# Patient Record
Sex: Female | Born: 1975 | Race: Black or African American | Hispanic: No
Health system: Southern US, Community
[De-identification: ages and names within clinical notes are randomized; demographics above are authoritative.]

## PROBLEM LIST (undated history)

## (undated) DIAGNOSIS — R569 Unspecified convulsions: Secondary | ICD-10-CM

## (undated) DIAGNOSIS — G039 Meningitis, unspecified: Secondary | ICD-10-CM

## (undated) DIAGNOSIS — G9340 Encephalopathy, unspecified: Secondary | ICD-10-CM

## (undated) HISTORY — PX: SHUNT REVISION VENTRICULAR-PERITONEAL: SHX6094

---

## 2016-03-23 ENCOUNTER — Emergency Department (HOSPITAL_BASED_OUTPATIENT_CLINIC_OR_DEPARTMENT_OTHER)
Admission: EM | Admit: 2016-03-23 | Discharge: 2016-03-23 | Disposition: A | Discharge status: Home / Self Care | Payer: Medicare Other | Attending: Emergency Medicine | Admitting: Emergency Medicine

## 2016-03-23 ENCOUNTER — Encounter (HOSPITAL_BASED_OUTPATIENT_CLINIC_OR_DEPARTMENT_OTHER): Payer: Self-pay

## 2016-03-23 ENCOUNTER — Emergency Department (HOSPITAL_BASED_OUTPATIENT_CLINIC_OR_DEPARTMENT_OTHER): Payer: Medicare Other

## 2016-03-23 DIAGNOSIS — Z79899 Other long term (current) drug therapy: Secondary | ICD-10-CM | POA: Insufficient documentation

## 2016-03-23 DIAGNOSIS — Z791 Long term (current) use of non-steroidal anti-inflammatories (NSAID): Secondary | ICD-10-CM | POA: Diagnosis not present

## 2016-03-23 DIAGNOSIS — K5900 Constipation, unspecified: Secondary | ICD-10-CM

## 2016-03-23 DIAGNOSIS — F918 Other conduct disorders: Secondary | ICD-10-CM | POA: Diagnosis present

## 2016-03-23 HISTORY — DX: Meningitis, unspecified: G03.9

## 2016-03-23 HISTORY — DX: Encephalopathy, unspecified: G93.40

## 2016-03-23 HISTORY — DX: Unspecified convulsions: R56.9

## 2016-03-23 LAB — URINALYSIS, ROUTINE W REFLEX MICROSCOPIC
BILIRUBIN URINE: NEGATIVE
Glucose, UA: NEGATIVE mg/dL
HGB URINE DIPSTICK: NEGATIVE
KETONES UR: NEGATIVE mg/dL
Leukocytes, UA: NEGATIVE
NITRITE: NEGATIVE
Protein, ur: NEGATIVE mg/dL
SPECIFIC GRAVITY, URINE: 1.016 (ref 1.005–1.030)
pH: 6.5 (ref 5.0–8.0)

## 2016-03-23 MED ORDER — POLYETHYLENE GLYCOL 3350 17 GM/SCOOP PO POWD: 17.0000 g | Freq: Two times a day (BID) | ORAL | 0 refills | Status: DC

## 2016-03-23 MED ORDER — FLEET ENEMA 7-19 GM/118ML RE ENEM: 1.0000 | ENEMA | Freq: Every day | RECTAL | 0 refills | Status: DC | PRN

## 2016-03-23 MED ORDER — DOCUSATE SODIUM 100 MG PO CAPS: 100.0000 mg | ORAL_CAPSULE | Freq: Two times a day (BID) | ORAL | 0 refills | Status: DC

## 2016-03-23 MED FILL — FLEET ENEMA: 7-19 | 2 days supply | Qty: 266 | Fill #0

## 2016-03-23 MED FILL — POLYETHYLENE GLYCOL 3350 PO: 7 days supply | Qty: 238 | Fill #0

## 2016-03-23 MED FILL — DOK 100 MG SOFTGEL: 100 | 50 days supply | Qty: 100 | Fill #0

## 2016-03-23 NOTE — ED Provider Notes (Signed)
[facility] Provider Note   CSN: [id number] Arrival date & time: 03/23/16  1547  First Provider Contact:   First MD Initiated Contact with Patient 03/23/16 1607     By signing my name below, I, Emmanuella Mensah, attest that this documentation has been prepared under the direction and in the presence of Roxy Horsemanobert Juana Haralson, PA-C. Electronically Signed: Angelene GiovanniEmmanuella Mensah, ED Scribe. 03/23/16. 4:20 PM.   History   Chief Complaint Chief Complaint  Patient presents with  . Aggressive Behavior    HPI Comments: Level 5 Caveat due to pt being nonverbal Laural GoldenKeisha Recht is a 40 y.o. female who presents to the Emergency Department with mother requesting UA for possible UTI. Mother states that she has noticed that pt's abdomen has been distended for approx one week and thinks that she is constipated. She adds that pt has been more irritable. No alleviating factors noted. Although pt drinks prune juice daily, she has occasional constipation.  Mother is concerned about UTI. She denies any hx of UTI. She also denies any fever, chills, cough, vomiting, or diarrhea.   She states that she is here because PCP couldn't do an in and out urine cath.  She doesn't want repeat blood work.   The history is provided by the patient. No language interpreter was used.    Past Medical History:  Diagnosis Date  . Encephalopathy   . Meningitis   . Seizures (HCC)     There are no active problems to display for this patient.   Past Surgical History:  Procedure Laterality Date  . SHUNT REVISION VENTRICULAR-PERITONEAL      OB History    No data available       Home Medications    Prior to Admission medications   Medication Sig Start Date End Date Taking? Authorizing Provider  carbamazepine (TEGRETOL) 200 MG tablet Take 200 mg by mouth 2 (two) times daily.   Yes Historical Provider, MD  ClonazePAM (KLONOPIN PO) Take by mouth.   Yes Historical Provider, MD  GABAPENTIN PO Take by mouth.   Yes  Historical Provider, MD  PHENobarbital (LUMINAL) 64.8 MG tablet Take 64.8 mg by mouth.   Yes Historical Provider, MD  TRAMADOL HCL ER PO Take by mouth.   Yes Historical Provider, MD    Family History No family history on file.  Social History Social History  Substance Use Topics  . Smoking status: Never Smoker  . Smokeless tobacco: Never Used  . Alcohol use No     Allergies   Review of patient's allergies indicates no known allergies.   Review of Systems Review of Systems  Unable to perform ROS: Patient nonverbal     Physical Exam Updated Vital Signs BP 102/78 (BP Location: Right Arm) Comment: with the assistance of PTs mother  Pulse 94   Temp 97.7 F (36.5 C) (Axillary) Comment: Pt unable to keep a gd oral seal  Resp 20   Ht 4\' 5"  (1.346 m) Comment: per Pts mother  Wt 108 lb (49 kg) Comment: per Pts mother  SpO2 100%   BMI 27.03 kg/m   Physical Exam  Constitutional: She appears well-developed and well-nourished.  HENT:  Head: Normocephalic and atraumatic.  Oropharynx is clear  Eyes: Conjunctivae and EOM are normal. Pupils are equal, round, and reactive to light.  Neck: Normal range of motion. Neck supple.  Cardiovascular: Normal rate and regular rhythm.  Exam reveals no gallop and no friction rub.   No murmur heard. Pulmonary/Chest: Effort normal and  breath sounds normal. No respiratory distress. She has no wheezes. She has no rales. She exhibits no tenderness.  CTAB  Abdominal: Soft. Bowel sounds are normal. She exhibits no distension and no mass. There is no tenderness. There is no rebound and no guarding.  No focal abdominal tenderness, no RLQ tenderness or pain at McBurney's point, no RUQ tenderness or Murphy's sign, no left-sided abdominal tenderness, no fluid wave, or signs of peritonitis   Musculoskeletal: Normal range of motion. She exhibits no edema or tenderness.  Moves all extremities Ambulates appropriately  Neurological: She is alert.  Skin:  Skin is warm and dry.  Psychiatric:  Unable to assess  Nursing note and vitals reviewed.    ED Treatments / Results  DIAGNOSTIC STUDIES: Oxygen Saturation is 100% on RA, normal by my interpretation.    COORDINATION OF CARE: 4:13 PM- Pt advised of plan for treatment and pt agrees. She will receive x-ray for further evaluation.   Radiology No results found.  Procedures Procedures (including critical care time)  Medications Ordered in ED Medications - No data to display   Initial Impression / Assessment and Plan / ED Course  Roxy Horseman, PA-C has reviewed the triage vital signs and the nursing notes.  Pertinent labs & imaging results that were available during my care of the patient were reviewed by me and considered in my medical decision making (see chart for details).  Clinical Course    Patient brought in by mother to have in-and-out UA.  PCP couldn't do this in the office today.  They did do labs.  Mother doesn't want labs here, just a UA.  I also offered chest and abdomen films given constipation.  Patient has no focal abdominal pain on exam.  Lungs sounds are clear.  She is well appearing.  UA negative.  Plain films are consistent with constipation.  Will prescribe bowel regimen.  Patient has close follow-up with PCP.  Return precautions discussed.  Final Clinical Impressions(s) / ED Diagnoses   Final diagnoses:  Constipation, unspecified constipation type   I personally performed the services described in this documentation, which was scribed in my presence. The recorded information has been reviewed and is accurate.     New Prescriptions New Prescriptions   DOCUSATE SODIUM (COLACE) 100 MG CAPSULE    Take 1 capsule (100 mg total) by mouth every 12 (twelve) hours.   POLYETHYLENE GLYCOL POWDER (GLYCOLAX/MIRALAX) POWDER    Take 17 g by mouth 2 (two) times daily. Until daily soft stools  OTC   SODIUM PHOSPHATE (FLEET) 7-19 GM/118ML ENEM    Place 133 mLs (1  enema total) rectally daily as needed for severe constipation.     Roxy Horseman, PA-C 03/23/16 1720    Arby Barrette, MD 03/23/16 2325

## 2016-03-23 NOTE — ED Triage Notes (Signed)
Mother reports pt with aggressive behavior x 1-2 weeks-pt is unable to express c/o-mother states she feels pt may have UTI or constipated-pt in w/c-was sent from PCP

## 2016-03-23 NOTE — ED Notes (Signed)
Patient transported to X-ray 

## 2016-03-23 NOTE — ED Notes (Addendum)
PA at bedside.

## 2016-03-23 NOTE — ED Notes (Signed)
D/C VS deferred. Pt developmentally delayed.

## 2016-03-23 NOTE — ED Notes (Signed)
Mother given d/c instructions as per chart. Verbalizes understanding. No questions. 

## 2016-11-30 ENCOUNTER — Emergency Department (HOSPITAL_BASED_OUTPATIENT_CLINIC_OR_DEPARTMENT_OTHER): Payer: Medicare Other

## 2016-11-30 ENCOUNTER — Emergency Department (HOSPITAL_BASED_OUTPATIENT_CLINIC_OR_DEPARTMENT_OTHER)
Admission: EM | Admit: 2016-11-30 | Discharge: 2016-11-30 | Disposition: A | Discharge status: Home / Self Care | Payer: Medicare Other | Attending: Emergency Medicine | Admitting: Emergency Medicine

## 2016-11-30 ENCOUNTER — Encounter (HOSPITAL_BASED_OUTPATIENT_CLINIC_OR_DEPARTMENT_OTHER): Payer: Self-pay | Admitting: Emergency Medicine

## 2016-11-30 DIAGNOSIS — Y92219 Unspecified school as the place of occurrence of the external cause: Secondary | ICD-10-CM | POA: Diagnosis not present

## 2016-11-30 DIAGNOSIS — Z79899 Other long term (current) drug therapy: Secondary | ICD-10-CM | POA: Diagnosis not present

## 2016-11-30 DIAGNOSIS — Y999 Unspecified external cause status: Secondary | ICD-10-CM | POA: Insufficient documentation

## 2016-11-30 DIAGNOSIS — W010XXA Fall on same level from slipping, tripping and stumbling without subsequent striking against object, initial encounter: Secondary | ICD-10-CM | POA: Insufficient documentation

## 2016-11-30 DIAGNOSIS — S99911A Unspecified injury of right ankle, initial encounter: Secondary | ICD-10-CM | POA: Diagnosis present

## 2016-11-30 DIAGNOSIS — S93401A Sprain of unspecified ligament of right ankle, initial encounter: Secondary | ICD-10-CM | POA: Diagnosis not present

## 2016-11-30 DIAGNOSIS — Y939 Activity, unspecified: Secondary | ICD-10-CM | POA: Insufficient documentation

## 2016-11-30 NOTE — Discharge Instructions (Signed)
Please read attached information. If you experience any new or worsening signs or symptoms please return to the emergency room for evaluation. Please follow-up with your primary care provider or specialist as discussed.  °

## 2016-11-30 NOTE — ED Provider Notes (Signed)
[facility]   CSN: [id number] Arrival date & time: 11/30/16  0944     History   Chief Complaint Chief Complaint  Patient presents with  . Ankle Pain    HPI Kimiko Common is a 41 y.o. female.  HPI   41 year old female with special needs nonverbal patient presents with her mother with reports of ankle and foot pain.  She notes last week she seem to be walking with a antalgic gait.  She notes tenderness along palpation of the lateral malleolus and proximal metatarsal.  She notes minor amount of swelling, no warmth, no fever, no other concerning signs or symptoms.  She denies any known history of trauma.  No medications prior to arrival, no other complaints.  Past Medical History:  Diagnosis Date  . Encephalopathy   . Meningitis   . Seizures (HCC)     There are no active problems to display for this patient.   Past Surgical History:  Procedure Laterality Date  . SHUNT REVISION VENTRICULAR-PERITONEAL      OB History    No data available       Home Medications    Prior to Admission medications   Medication Sig Start Date End Date Taking? Authorizing Provider  carbamazepine (TEGRETOL) 200 MG tablet Take 200 mg by mouth 2 (two) times daily.   Yes Historical Provider, MD  ClonazePAM (KLONOPIN PO) Take by mouth.   Yes Historical Provider, MD  PHENobarbital (LUMINAL) 64.8 MG tablet Take 64.8 mg by mouth.   Yes Historical Provider, MD    Family History No family history on file.  Social History Social History  Substance Use Topics  . Smoking status: Never Smoker  . Smokeless tobacco: Never Used  . Alcohol use No     Allergies   Patient has no known allergies.   Review of Systems Review of Systems  All other systems reviewed and are negative.    Physical Exam Updated Vital Signs BP 98/60 (BP Location: Right Arm)   Pulse 88   Temp 98.9 F (37.2 C) (Oral)   Resp 18   Ht  (1.422 m)   Wt 51.3 kg   SpO2 100%   BMI  25.33 kg/m   Physical Exam  Constitutional: She is oriented to person, place, and time. She appears well-developed and well-nourished.  HENT:  Head: Normocephalic and atraumatic.  Eyes: Conjunctivae are normal. Pupils are equal, round, and reactive to light. Right eye exhibits no discharge. Left eye exhibits no discharge. No scleral icterus.  Neck: Normal range of motion. No JVD present. No tracheal deviation present.  Pulmonary/Chest: Effort normal. No stridor.  Musculoskeletal:  Very minor swelling to the right lateral ankle, tenderness palpation of the lateral malleolus and proximal fifth metatarsal.  No open wounds, no redness, no warmth to touch remainder of ankle/foot nontender.  No tenderness to palpation of the knee.  Neurological: She is alert and oriented to person, place, and time. Coordination normal.  Psychiatric: She has a normal mood and affect. Her behavior is normal. Judgment and thought content normal.  Nursing note and vitals reviewed.    ED Treatments / Results  Labs (all labs ordered are listed, but only abnormal results are displayed) Labs Reviewed - No data to display  EKG  EKG Interpretation None       Radiology Dg Ankle Complete Right  Result Date: 11/30/2016 CLINICAL DATA:  Trip and fall with right ankle pain, initial encounter EXAM: RIGHT ANKLE - COMPLETE 3+  VIEW COMPARISON:  None. FINDINGS: There is no evidence of fracture, dislocation, or joint effusion. There is no evidence of arthropathy or other focal bone abnormality. Soft tissues are unremarkable. IMPRESSION: No acute abnormality noted. Electronically Signed   By: Alcide Clever M.D.   On: 11/30/2016 11:35   Dg Foot Complete Right  Result Date: 11/30/2016 CLINICAL DATA:  Right foot pain, with right lateral ankle pain EXAM: RIGHT FOOT COMPLETE - 3+ VIEW COMPARISON:  None. FINDINGS: Three views of the right foot submitted. No acute fracture or subluxation. There is short variant fourth metatarsal  with subsequent shortening of the left fourth toe. No periosteal reaction or bony erosion. IMPRESSION: No acute fracture or subluxation. 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 No periosteal reaction or bony erosion. Electronically Signed   By: Natasha Mead M.D.   On: 11/30/2016 10:55    Procedures Procedures (including critical care time)  SPLINT APPLICATION Date/Time: 11:40 AM Authorized by: Thermon Leyland Consent: Verbal consent obtained. Risks and benefits: risks, benefits and alternatives were discussed Consent given by: patient Splint applied by: tech Location details: right ankle Splint type: velcro aso Supplies used: velcro aso Post-procedure: The splinted body part was neurovascularly unchanged following the procedure. Patient tolerance: Patient tolerated the procedure well with no immediate complications.    Medications Ordered in ED Medications - No data to display   Initial Impression / Assessment and Plan / ED Course  I have reviewed the triage vital signs and the nursing notes.  Pertinent labs & imaging results that were available during my care of the patient were reviewed by me and considered in my medical decision making (see chart for details).      Final Clinical Impressions(s) / ED Diagnoses   Final diagnoses:  Sprain of right ankle, unspecified ligament, initial encounter   41 year old female presents today with ankle pain.  Patient is nonverbal.  She has very minor swelling to the lateral ankle, no signs of infectious etiology.  I suspect ankle sprain.  Patient will have plain films to rule out fracture.  She will be placed in ASO and given orthopedic follow-up if no acute fracture noted.  Mother verbalized understanding and agreement to today's plan had no further questions or concerns.   New Prescriptions New Prescriptions   No medications on file     Eyvonne Mechanic, PA-C 11/30/16 1140      Melene Plan, DO 11/30/16 [phone]

## 2016-11-30 NOTE — ED Triage Notes (Signed)
Pt tripped yesterday at school.  This am caregiver noticed swelling and nodule to right ankle area.  Pt not acting like her baseline like something is wrong.

## 2017-07-21 IMAGING — DX DG ANKLE COMPLETE 3+V*R*
3 series · 3 of 3 positions shown · non-contrast
Comparison: None.

CLINICAL DATA: Trip and fall with right ankle pain, initial
encounter

EXAM:
RIGHT ANKLE - COMPLETE 3+ VIEW

[ankle ap]
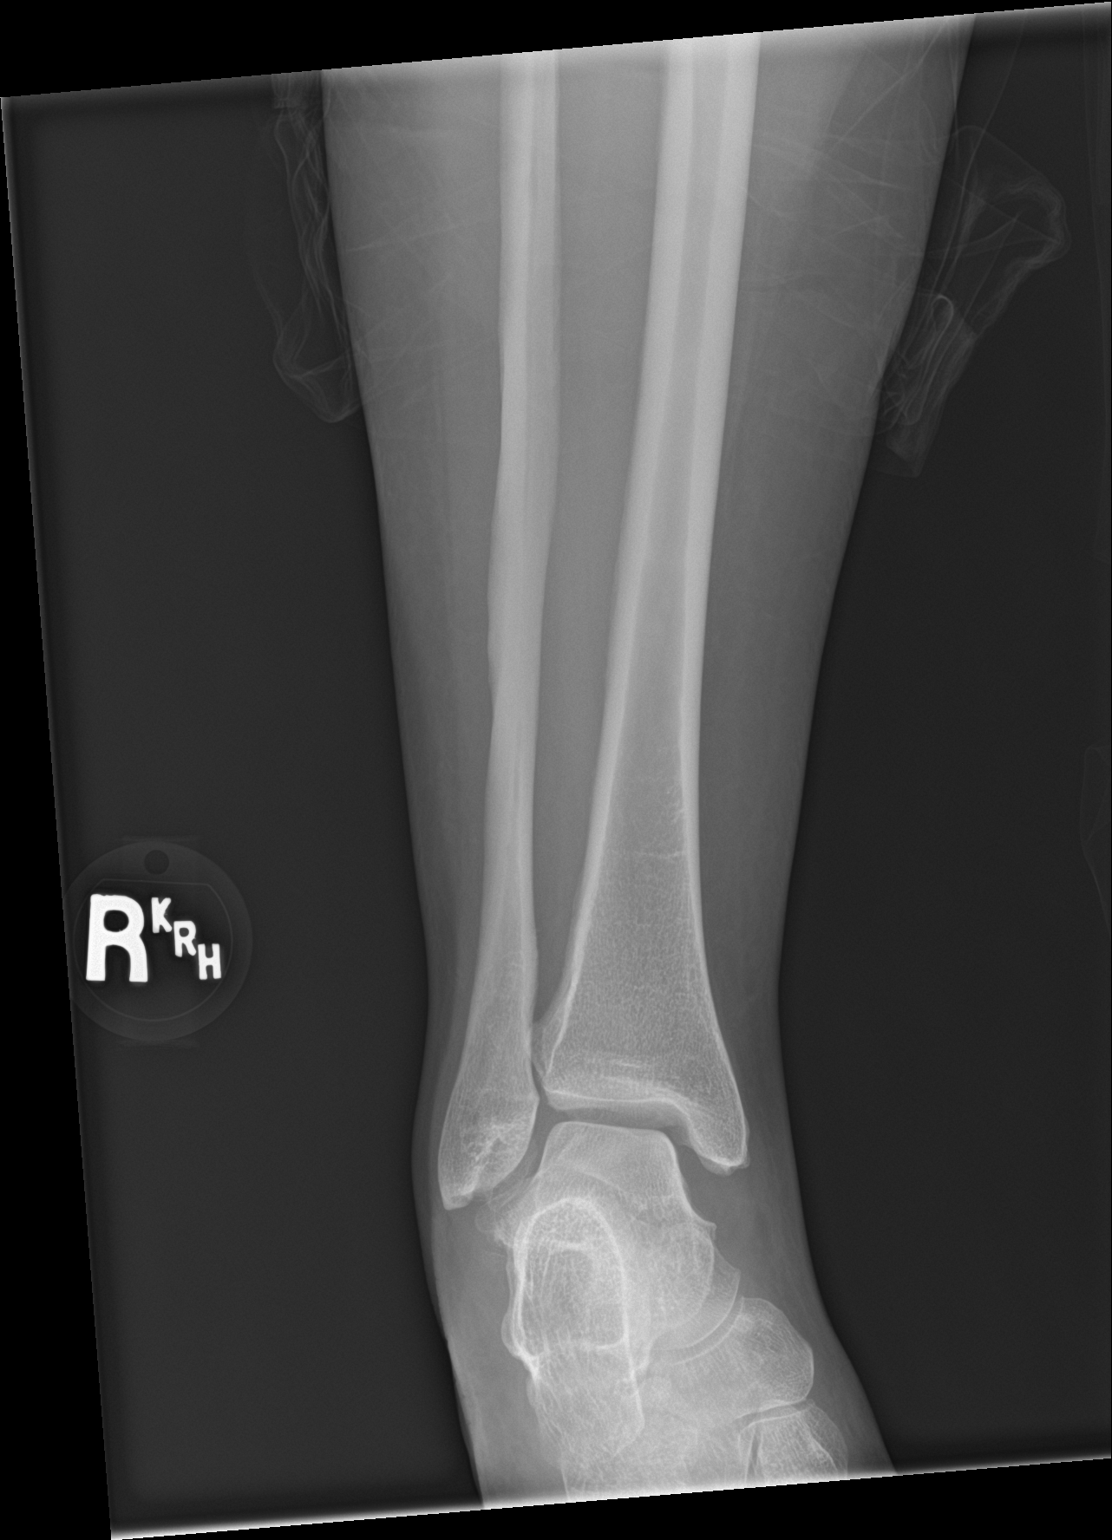

[ankle obl]
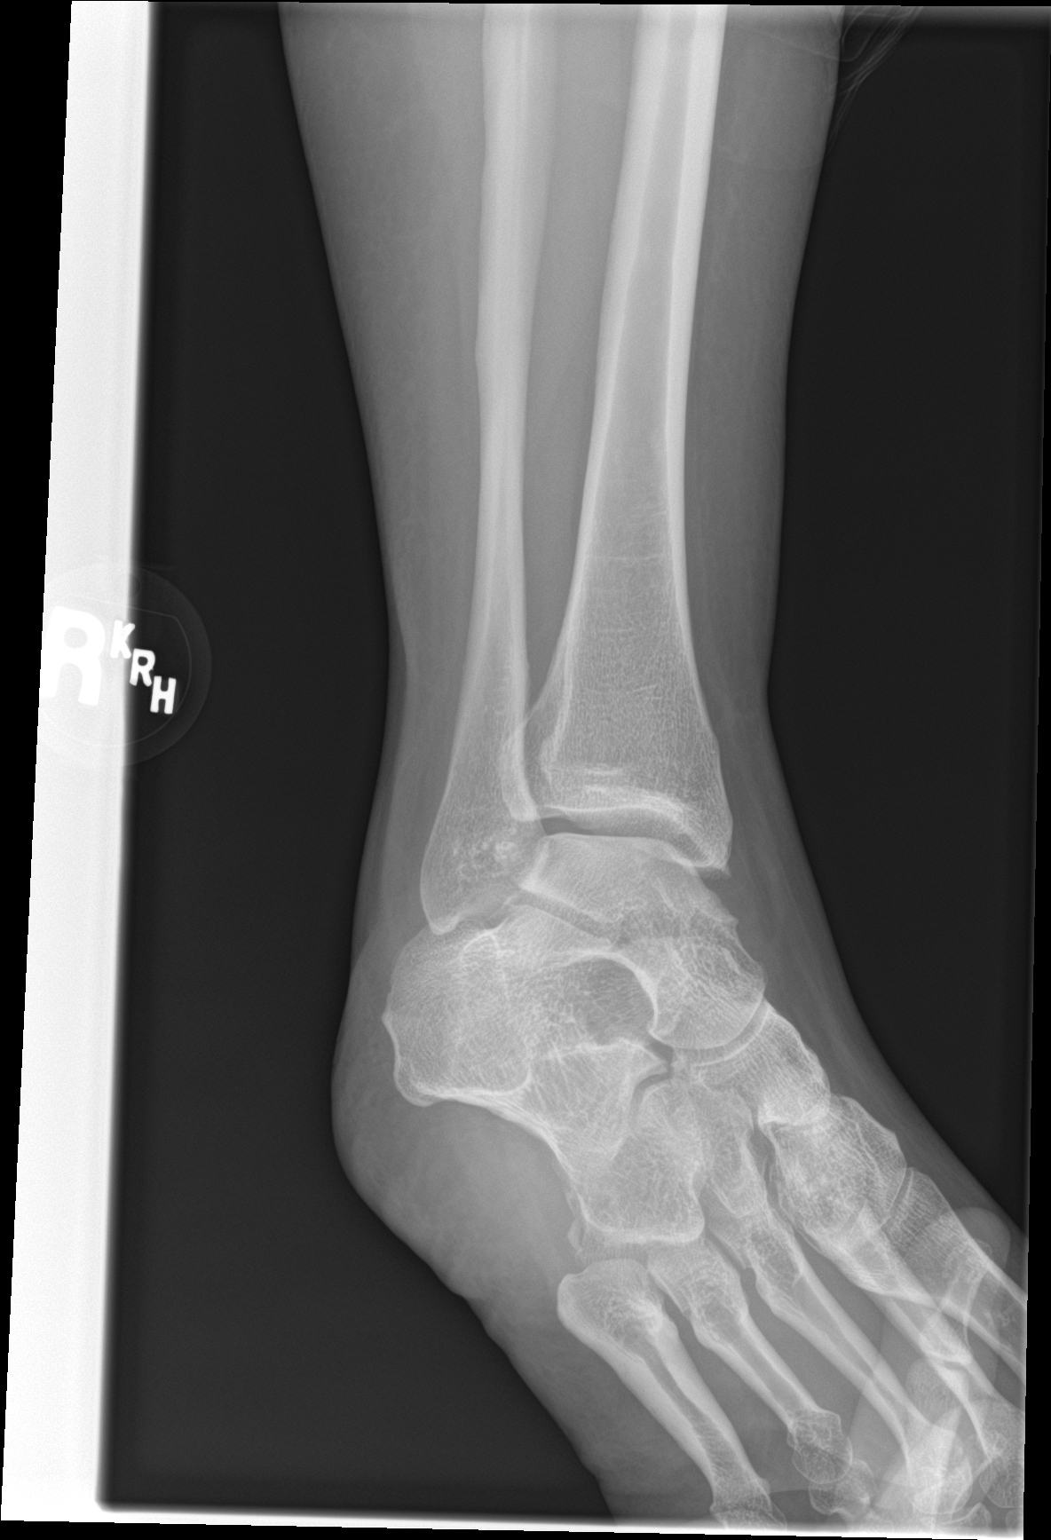

[ankle lat]
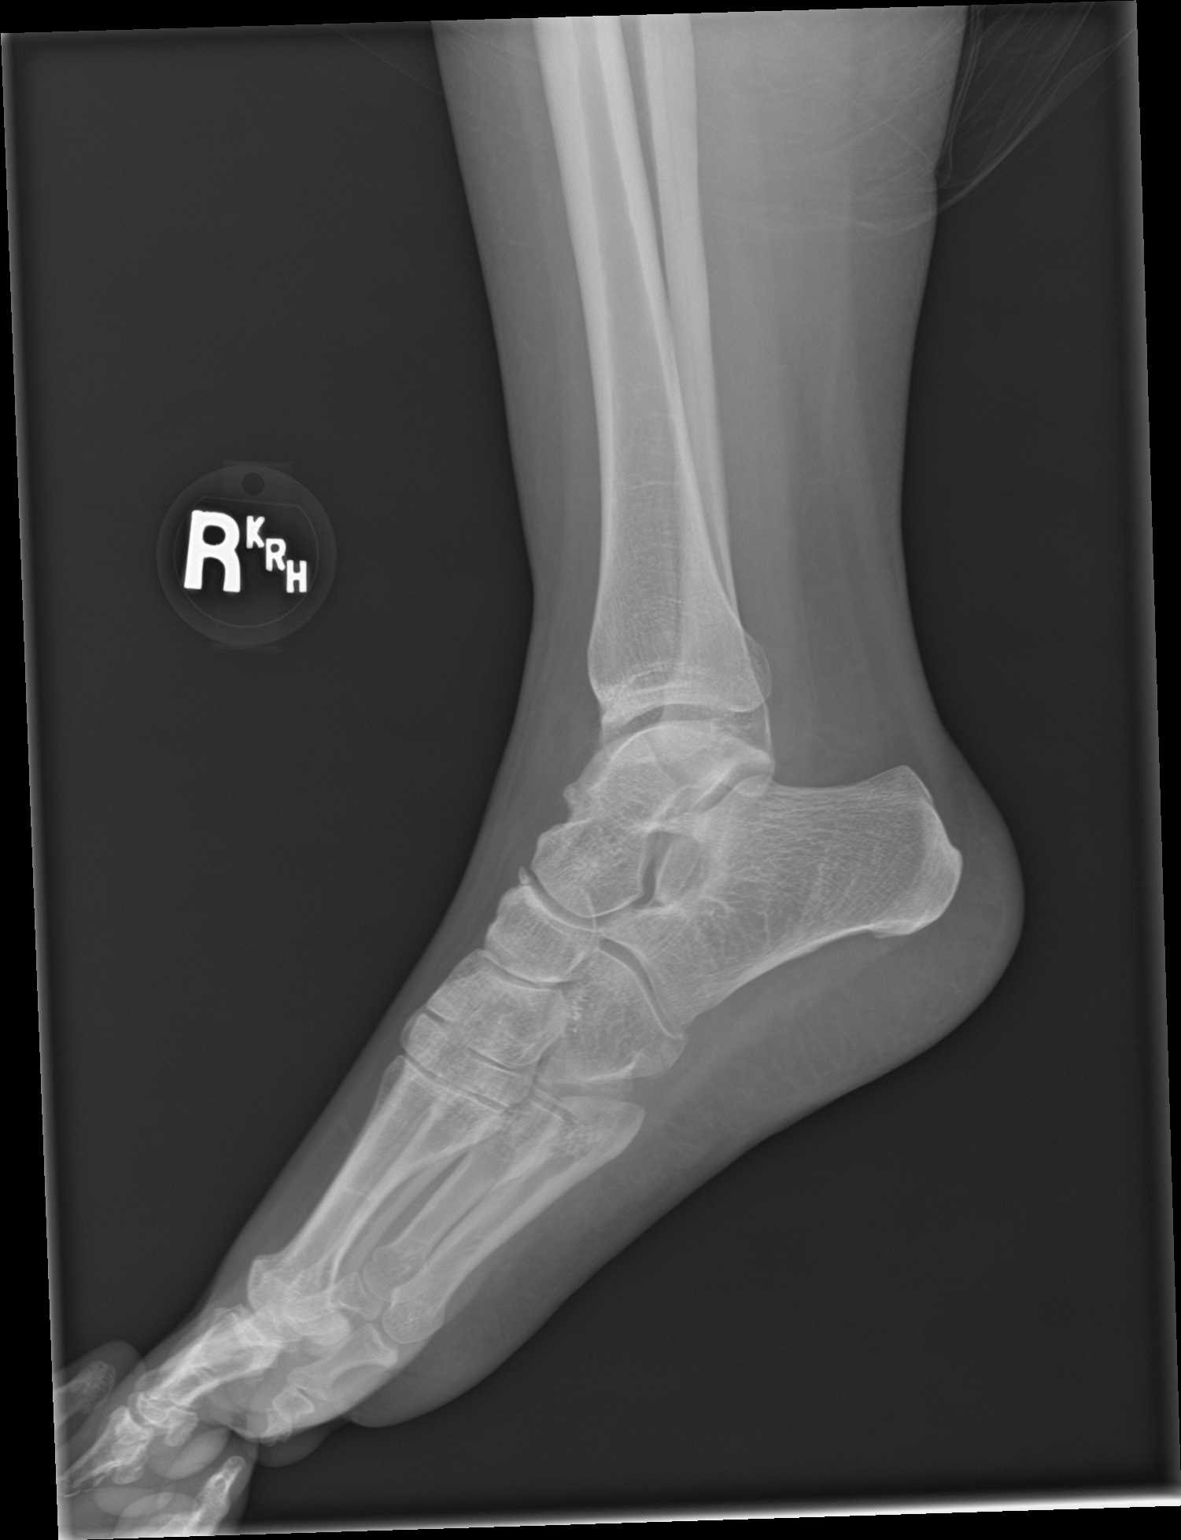

[3 of 3 positions shown; findings below may reference images not displayed]

FINDINGS: There is no evidence of fracture, dislocation, or joint effusion.
There is no evidence of arthropathy or other focal bone abnormality.
Soft tissues are unremarkable.
IMPRESSION: No acute abnormality noted.

## 2019-10-24 ENCOUNTER — Ambulatory Visit: Payer: Medicare Other | Attending: Internal Medicine

## 2019-10-24 DIAGNOSIS — Z23 Encounter for immunization: Secondary | ICD-10-CM

## 2019-10-24 NOTE — Progress Notes (Signed)
   Covid-19 Vaccination Clinic  Name:  Margaret Freeman    MRN: [id number] DOB: 02-Oct-1975  10/24/2019  [name] was observed post Covid-19 immunization for 15 minutes without incident. She was provided with Vaccine Information Sheet and instruction to access the V-Safe system.   [name] was instructed to call 911 with any severe reactions post vaccine: Marland Kitchen Difficulty breathing  . Swelling of face and throat  . A fast heartbeat  . A bad rash all over body  . Dizziness and weakness   Immunizations Administered    Name Date Dose VIS Date Route   Pfizer COVID-19 Vaccine 10/24/2019 11:04 AM 0.3 mL 07/26/2019 Intramuscular   Manufacturer: [employer], [employer]   Lot: [id number]   NDC: [address]-3

## 2019-11-20 ENCOUNTER — Ambulatory Visit: Payer: Self-pay

## 2019-11-27 ENCOUNTER — Ambulatory Visit: Payer: Medicare Other | Attending: Internal Medicine

## 2019-11-27 DIAGNOSIS — Z23 Encounter for immunization: Secondary | ICD-10-CM

## 2019-11-27 NOTE — Progress Notes (Signed)
   Covid-19 Vaccination Clinic  Name:  Yariela Tison    MRN: [id number] DOB: Jul 22, 1976  11/27/2019  Ms. Swilling was observed post Covid-19 immunization for 15 minutes without incident. She was provided with Vaccine Information Sheet and instruction to access the V-Safe system.   Ms. Durley was instructed to call 911 with any severe reactions post vaccine: Marland Kitchen Difficulty breathing  . Swelling of face and throat  . A fast heartbeat  . A bad rash all over body  . Dizziness and weakness   Immunizations Administered    Name Date Dose VIS Date Route   Pfizer COVID-19 Vaccine 11/27/2019 12:48 PM 0.3 mL 07/26/2019 Intramuscular   Manufacturer: [employer], [employer]   Lot: [id number]   NDC: [address]-1

## 2024-03-20 ENCOUNTER — Encounter: Admitting: Obstetrics and Gynecology
